# Patient Record
Sex: Male | Born: 1965 | Race: White | Hispanic: No | Marital: Married | Smoking: Never smoker
Health system: Southern US, Community
[De-identification: ages and names within clinical notes are randomized; demographics above are authoritative.]

---

## 2014-07-21 ENCOUNTER — Ambulatory Visit (INDEPENDENT_AMBULATORY_CARE_PROVIDER_SITE_OTHER): Payer: BC Managed Care – PPO | Admitting: Family Medicine

## 2014-07-21 ENCOUNTER — Encounter: Payer: Self-pay | Admitting: Family Medicine

## 2014-07-21 ENCOUNTER — Encounter (INDEPENDENT_AMBULATORY_CARE_PROVIDER_SITE_OTHER): Payer: Self-pay

## 2014-07-21 VITALS — BP 146/91 | HR 47 | Ht 70.0 in | Wt 170.0 lb

## 2014-07-21 DIAGNOSIS — M7662 Achilles tendinitis, left leg: Secondary | ICD-10-CM

## 2014-07-21 NOTE — Patient Instructions (Signed)
You have severe insertional achilles tendinitis. Wear cam walker with heel lifts every time you're up and walking around. Icing 15 minutes at a time 3-4 times a day. Ibuprofen 600mg  three times a day with food OR aleve 2 tabs twice a day with food for pain and inflammation regularly. If needed use crutches. Try to avoid inclines, hills, stairs as much as you can. Follow up with me in 7-10 days.

## 2014-07-22 ENCOUNTER — Encounter: Payer: Self-pay | Admitting: Family Medicine

## 2014-07-22 DIAGNOSIS — M7660 Achilles tendinitis, unspecified leg: Secondary | ICD-10-CM | POA: Insufficient documentation

## 2014-07-22 NOTE — Progress Notes (Signed)
Patient ID: Joshua Pacheco, male   DOB: Mar 26, 1966, 48 y.o.   MRN: [id number]  PCP: No primary provider on file.  Subjective:   HPI: Patient is a 48 y.o. male here for left foot pain.  Patient denies known injury. States for several weeks he has had posterior left heel pain off and on but it would resolve with icing and rest. Then about 4 days ago pain become more consistent, severe.   Runs 25 miles per week but nothing unusual about his last run on Thursday and no pain until Saturday-Sunday. Taking ibuprofen. Doing stretching, massage. No swelling or bruising.  History reviewed. No pertinent past medical history.  No current outpatient prescriptions on file prior to visit.   No current facility-administered medications on file prior to visit.    History reviewed. No pertinent past surgical history.  Allergies  Allergen Reactions  . Amoxicillin     History   Social History  . Marital Status: Married    Spouse Name: N/A    Number of Children: N/A  . Years of Education: N/A   Occupational History  . Not on file.   Social History Main Topics  . Smoking status: Never Smoker   . Smokeless tobacco: Not on file  . Alcohol Use: Not on file  . Drug Use: Not on file  . Sexual Activity: Not on file   Other Topics Concern  . Not on file   Social History Narrative  . No narrative on file    No family history on file.  BP 146/91  Pulse 47  Ht 5\' 10"  (1.778 m)  Wt 170 lb (77.111 kg)  BMI 24.39 kg/m2  Review of Systems: See HPI above.    Objective:  Physical Exam:  Gen: NAD  Left foot/ankle: No gross deformity, swelling, ecchymoses FROM ankle with pain on full dorsiflexion and plantar flexion.  Cannot do single leg calf raise. TTP achilles at insertion.  No plantar fascia, other foot/ankle tenderness. Negative ant drawer and talar tilt.   Negative syndesmotic compression. Thompsons test negative. Negative calcaneal squeeze. NV intact distally.     Assessment & Plan:  1. Left insertional achilles tendinitis - due to severity placed in cam walker with heel lift.  Icing, nsaids.  Avoid hills, inclines, stairs.  F/u in 7-10 days.

## 2014-07-22 NOTE — Assessment & Plan Note (Signed)
due to severity placed in cam walker with heel lift.  Icing, nsaids.  Avoid hills, inclines, stairs.  F/u in 7-10 days.

## 2014-07-30 ENCOUNTER — Ambulatory Visit: Payer: BC Managed Care – PPO | Admitting: Family Medicine

## 2019-05-05 ENCOUNTER — Telehealth: Payer: Self-pay | Admitting: Physician Assistant

## 2019-05-05 ENCOUNTER — Encounter: Payer: Self-pay | Admitting: Physician Assistant

## 2019-05-05 DIAGNOSIS — Z20822 Contact with and (suspected) exposure to covid-19: Secondary | ICD-10-CM

## 2019-05-05 DIAGNOSIS — R059 Cough, unspecified: Secondary | ICD-10-CM

## 2019-05-05 DIAGNOSIS — J029 Acute pharyngitis, unspecified: Secondary | ICD-10-CM

## 2019-05-05 DIAGNOSIS — G4489 Other headache syndrome: Secondary | ICD-10-CM

## 2019-05-05 DIAGNOSIS — R05 Cough: Secondary | ICD-10-CM

## 2019-05-05 MED ORDER — BENZONATATE 100 MG PO CAPS: 100.0000 mg | ORAL_CAPSULE | Freq: Two times a day (BID) | ORAL | 0 refills | Status: AC | PRN

## 2019-05-05 NOTE — Progress Notes (Signed)
E-Visit for Corona Virus Screening   Your current symptoms could be consistent with the coronavirus.  Many health care providers can now test patients at their office but not all are.  [facility] has multiple testing sites. For information on our COVID testing locations and hours go to [email or web address]  Please quarantine yourself while awaiting your test results.  We are enrolling you in our MyChart Home Montioring for COVID19 . Daily you will receive a questionnaire within the MyChart website. Our COVID 19 response team willl be monitoriing your responses daily.    COVID-19 is a respiratory illness with symptoms that are similar to the flu. Symptoms are typically mild to moderate, but there have been cases of severe illness and death due to the virus. The following symptoms may appear 2-14 days after exposure: . Fever . Cough . Shortness of breath or difficulty breathing . Chills . Repeated shaking with chills . Muscle pain . Headache . Sore throat . New loss of taste or smell . Fatigue . Congestion or runny nose . Nausea or vomiting . Diarrhea  It is vitally important that if you feel that you have an infection such as this virus or any other virus that you stay home and away from places where you may spread it to others.  You should self-quarantine for 14 days if you have symptoms that could potentially be coronavirus or have been in close contact a with a person diagnosed with COVID-19 within the last 2 weeks. You should avoid contact with people age 65 and older.   You should wear a mask or cloth face covering over your nose and mouth if you must be around other people or animals, including pets (even at home). Try to stay at least 6 feet away from other people. This will protect the people around you.  You can use medication such as A prescription cough medication called Tessalon Perles 100 mg. You may take 1-2 capsules every 8 hours as needed for  cough  You may also take acetaminophen (Tylenol) as needed for fever.  I have provided a work note  You have been enrolled in Evisit Covid19 Home Monitoring questionnaire, which will contact you regarding your symptoms.   Reduce your risk of any infection by using the same precautions used for avoiding the common cold or flu:  . Wash your hands often with soap and warm water for at least 20 seconds.  If soap and water are not readily available, use an alcohol-based hand sanitizer with at least 60% alcohol.  . If coughing or sneezing, cover your mouth and nose by coughing or sneezing into the elbow areas of your shirt or coat, into a tissue or into your sleeve (not your hands). . Avoid shaking hands with others and consider head nods or verbal greetings only. . Avoid touching your eyes, nose, or mouth with unwashed hands.  . Avoid close contact with people who are sick. . Avoid places or events with large numbers of people in one location, like concerts or sporting events. . Carefully consider travel plans you have or are making. . If you are planning any travel outside or inside the [place], visit the CDC's Travelers' Health webpage for the latest health notices. . If you have some symptoms but not all symptoms, continue to monitor at home and seek medical attention if your symptoms worsen. . If you are having a medical emergency, call 911.  HOME CARE . Only take medications as instructed by your medical   team. . Drink plenty of fluids and get plenty of rest. . A steam or ultrasonic humidifier can help if you have congestion.   GET HELP RIGHT AWAY IF YOU HAVE EMERGENCY WARNING SIGNS** FOR COVID-19. If you or someone is showing any of these signs seek emergency medical care immediately. Call 911 or proceed to your closest emergency facility if: . You develop worsening high fever. . Trouble breathing . Bluish lips or face . Persistent pain or pressure in the chest . New confusion . Inability  to wake or stay awake . You cough up blood. . Your symptoms become more severe  **This list is not all possible symptoms. Contact your medical provider for any symptoms that are sever or concerning to you.   MAKE SURE YOU   Understand these instructions.  Will watch your condition.  Will get help right away if you are not doing well or get worse.  Your e-visit answers were reviewed by a board certified advanced clinical practitioner to complete your personal care plan.  Depending on the condition, your plan could have included both over the counter or prescription medications.  If there is a problem please reply once you have received a response from your provider.  Your safety is important to us.  If you have drug allergies check your prescription carefully.    You can use MyChart to ask questions about today's visit, request a non-urgent call back, or ask for a work or school excuse for 24 hours related to this e-Visit. If it has been greater than 24 hours you will need to follow up with your provider, or enter a new e-Visit to address those concerns. You will get an e-mail in the next two days asking about your experience.  I hope that your e-visit has been valuable and will speed your recovery. Thank you for using e-visits.   I spent 5-10 minutes on review and completion of this note- Ajene Carchi PAC  

## 2019-08-19 ENCOUNTER — Other Ambulatory Visit: Payer: Self-pay

## 2019-08-19 ENCOUNTER — Ambulatory Visit (INDEPENDENT_AMBULATORY_CARE_PROVIDER_SITE_OTHER): Payer: BC Managed Care – PPO | Admitting: Otolaryngology

## 2019-08-19 VITALS — Temp 97.2°F

## 2019-08-19 DIAGNOSIS — H6981 Other specified disorders of Eustachian tube, right ear: Secondary | ICD-10-CM

## 2019-08-19 DIAGNOSIS — H9201 Otalgia, right ear: Secondary | ICD-10-CM | POA: Diagnosis not present

## 2019-08-19 NOTE — Progress Notes (Signed)
HPI: Joshua Pacheco is a 53 y.o. male who returns today for evaluation of complaints of right ear fullness occasional pain or discomfort in the right ear which generally is more below the ear.  At times he feels like he has fluid in the ear.  The pain is sometimes position dependent.  The symptoms come and go but have been present for several months now.  He denies sore throat.  Chewing does not seem to make it worse.  He has had no drainage from the ear and no real hearing problems. He does not smoke. He has tried nasal steroid sprays since last visit but this does not seem to make that much difference..  No past medical history on file. No past surgical history on file. Social History   Socioeconomic History  . Marital status: Married    Spouse name: Not on file  . Number of children: Not on file  . Years of education: Not on file  . Highest education level: Not on file  Occupational History  . Not on file  Social Needs  . Financial resource strain: Not on file  . Food insecurity    Worry: Not on file    Inability: Not on file  . Transportation needs    Medical: Not on file    Non-medical: Not on file  Tobacco Use  . Smoking status: Never Smoker  Substance and Sexual Activity  . Alcohol use: Not on file  . Drug use: Not on file  . Sexual activity: Not on file  Lifestyle  . Physical activity    Days per week: Not on file    Minutes per session: Not on file  . Stress: Not on file  Relationships  . Social Herbalist on phone: Not on file    Gets together: Not on file    Attends religious service: Not on file    Active member of club or organization: Not on file    Attends meetings of clubs or organizations: Not on file    Relationship status: Not on file  Other Topics Concern  . Not on file  Social History Narrative  . Not on file   No family history on file. Allergies  Allergen Reactions  . Amoxicillin    Prior to Admission medications   Medication  Sig Start Date End Date Taking? Authorizing Provider  benzonatate (TESSALON) 100 MG capsule Take 1 capsule (100 mg total) by mouth 2 (two) times daily as needed for cough. 05/05/19   Lacy Duverney M, PA-C     Positive ROS: Otherwise negative  All other systems have been reviewed and were otherwise negative with the exception of those mentioned in the HPI and as above.  Physical Exam: General: Alert, no acute distress Ears: Ear canals are clear bilaterally with intact, clear TMs.  TMs have normal mobility on pneumatic otoscopy.  Right ear middle ear space is clear under microscopic examination.  Tuning fork testing revealed symmetric hearing with good hearing in both ears and AC > BC bilaterally. Nasal: Clear nasal passages.  Mild rhinitis no signs of infection Oral: Clear oropharynx.  Tonsillar regions are benign in appearance bilaterally. Neck: No palpable adenopathy or masses.  No palpable masses around the right ear.  Ears not painful to movement.  No palpable TMJ pain discomfort or problems.   Assessment: Right otalgia questionable etiology possible eustachian tube dysfunction versus neuropathic pain.  Plan: Would recommend continue with the nasal steroid spray on a  regular basis.  He can occasionally try decongestant therapy to see if this helps at all. If he continues with a sensation of fluid or pressure in the ear could consider myringotomy in the future. Recommended use of NSAIDs on a regular basis for 2 weeks to see if this helps resolve the pain. No indication presently but if symptoms persist could consider temporal bone CT scan.   Narda Bonds, MD

## 2019-11-16 ENCOUNTER — Other Ambulatory Visit (INDEPENDENT_AMBULATORY_CARE_PROVIDER_SITE_OTHER): Payer: Self-pay

## 2019-11-16 ENCOUNTER — Ambulatory Visit (INDEPENDENT_AMBULATORY_CARE_PROVIDER_SITE_OTHER): Payer: BC Managed Care – PPO | Admitting: Otolaryngology

## 2019-11-16 DIAGNOSIS — G8929 Other chronic pain: Secondary | ICD-10-CM

## 2019-12-11 ENCOUNTER — Other Ambulatory Visit (INDEPENDENT_AMBULATORY_CARE_PROVIDER_SITE_OTHER): Payer: Self-pay | Admitting: Otolaryngology

## 2019-12-11 DIAGNOSIS — G8929 Other chronic pain: Secondary | ICD-10-CM

## 2019-12-21 ENCOUNTER — Other Ambulatory Visit: Payer: Self-pay

## 2019-12-21 ENCOUNTER — Ambulatory Visit
Admission: RE | Admit: 2019-12-21 | Discharge: 2019-12-21 | Disposition: A | Discharge status: Home / Self Care | Payer: BC Managed Care – PPO | Source: Ambulatory Visit | Attending: Otolaryngology | Admitting: Otolaryngology

## 2019-12-21 DIAGNOSIS — G8929 Other chronic pain: Secondary | ICD-10-CM

## 2019-12-21 MED ORDER — IOPAMIDOL (ISOVUE-300) INJECTION 61%
75.0000 mL | Freq: Once | INTRAVENOUS | Status: AC | PRN
  Administered 2019-12-21: 09:00:00 75 mL via INTRAVENOUS

## 2020-01-05 ENCOUNTER — Telehealth (INDEPENDENT_AMBULATORY_CARE_PROVIDER_SITE_OTHER): Payer: Self-pay | Admitting: Otolaryngology

## 2020-01-05 NOTE — Telephone Encounter (Signed)
Discussed with Joshua Pacheco today on the phone concerning results of his temporal bone CT scan.  This showed normal temporal bone with no explanation for right ear discomfort, right ear pressure, or pulsatile tinnitus.  The middle ear space and mastoid area was clear.  Eustachian tube was unobstructed in nasopharynx was clear. He states that the discomfort in the right ear tends to get worse when he is sitting at the computer for long period of time.  This would indicate the pain may be more musculoskeletal in nature or possibly neuropathy from cervical nerves.  Suggested use of NSAIDs which he has used.

## 2020-01-08 ENCOUNTER — Ambulatory Visit: Payer: BC Managed Care – PPO | Attending: Internal Medicine

## 2020-01-08 DIAGNOSIS — Z23 Encounter for immunization: Secondary | ICD-10-CM

## 2020-01-08 NOTE — Progress Notes (Signed)
   Covid-19 Vaccination Clinic  Name:  Arkin Imran    MRN: [id number] DOB: 1966/08/22  01/08/2020  Mr. Sedano was observed post Covid-19 immunization for 15 minutes without incident. He was provided with Vaccine Information Sheet and instruction to access the V-Safe system.   Mr. Shenk was instructed to call 911 with any severe reactions post vaccine: Marland Kitchen Difficulty breathing  . Swelling of face and throat  . A fast heartbeat  . A bad rash all over body  . Dizziness and weakness   Immunizations Administered    Name Date Dose VIS Date Route   Pfizer COVID-19 Vaccine 01/08/2020 10:13 AM 0.3 mL 09/25/2019 Intramuscular   Manufacturer: [employer], [employer]   Lot: [id number]   NDC: [address]-4

## 2020-02-02 ENCOUNTER — Ambulatory Visit: Payer: BC Managed Care – PPO

## 2020-02-03 ENCOUNTER — Ambulatory Visit: Payer: BC Managed Care – PPO | Attending: Internal Medicine

## 2020-02-03 DIAGNOSIS — Z23 Encounter for immunization: Secondary | ICD-10-CM

## 2020-02-03 NOTE — Progress Notes (Signed)
   Covid-19 Vaccination Clinic  Name:  Joshua Pacheco    MRN: [id number] DOB: 01/12/1966  02/03/2020  [name] was observed post Covid-19 immunization for 15 minutes without incident. He was provided with Vaccine Information Sheet and instruction to access the V-Safe system.   [name] was instructed to call 911 with any severe reactions post vaccine: Marland Kitchen Difficulty breathing  . Swelling of face and throat  . A fast heartbeat  . A bad rash all over body  . Dizziness and weakness   Immunizations Administered    Name Date Dose VIS Date Route   Pfizer COVID-19 Vaccine 02/03/2020  2:30 PM 0.3 mL 12/09/2018 Intramuscular   Manufacturer: [employer], [employer]   Lot: [id number]   NDC: [address]-4

## 2020-11-15 IMAGING — CT CT TEMPORAL BONES W/ CM
2 series · 15 of 40 positions shown, 18 images · IV contrast (iopamidol)
Comparison: None.

CLINICAL DATA: Pulsatile tinnitus.  Chronic right ear pain

EXAM:
CT TEMPORAL BONES WITH CONTRAST
TECHNIQUE: Axial and coronal plane CT imaging of the petrous temporal bones was
performed with thin-collimation image reconstruction after
intravenous contrast administration. Multiplanar CT image
reconstructions were also generated.
CONTRAST:  75mL KIPVYX-MNN IOPAMIDOL (KIPVYX-MNN) INJECTION 61%

[Series 4: soft tissue · axial · 0.31mm/px · z∈[-100,-24]mm · 12 of 44 slices shown, 15 images]
[im 3/44  brain]
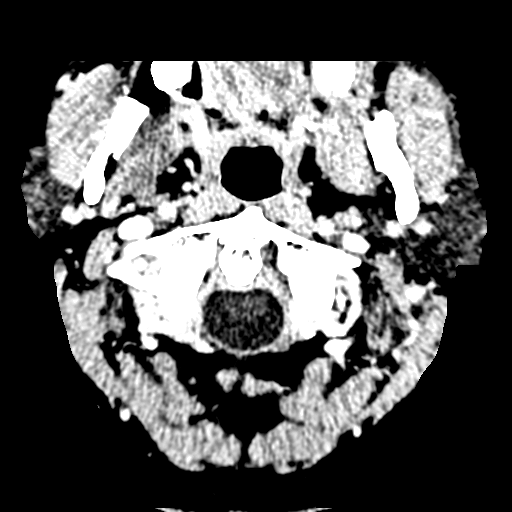
[im 3/44  bone]
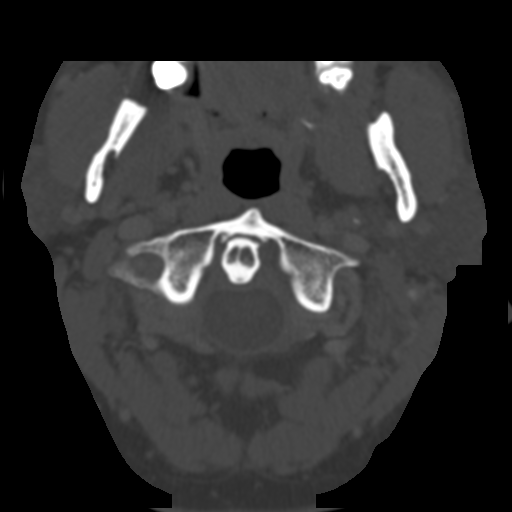
[im 6/44  bone]
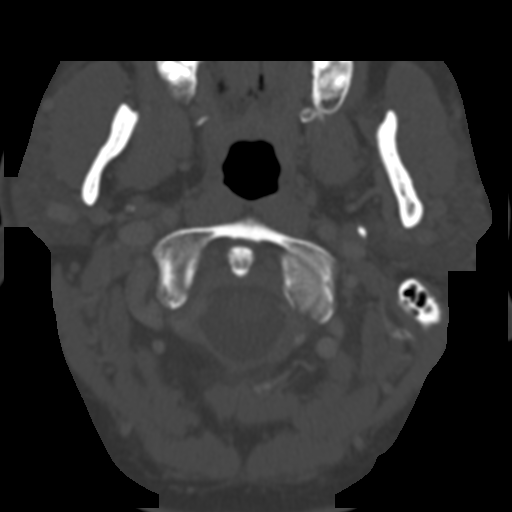
[im 9/44  bone]
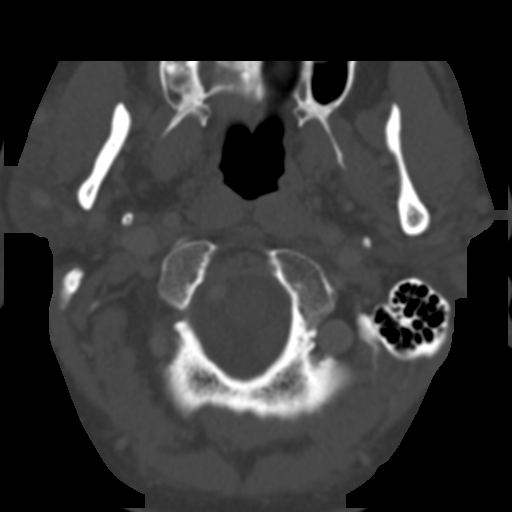
[im 14/44  bone]
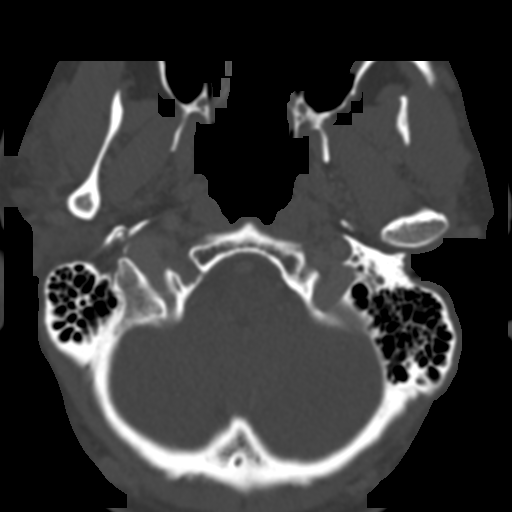
[im 17/44  brain]
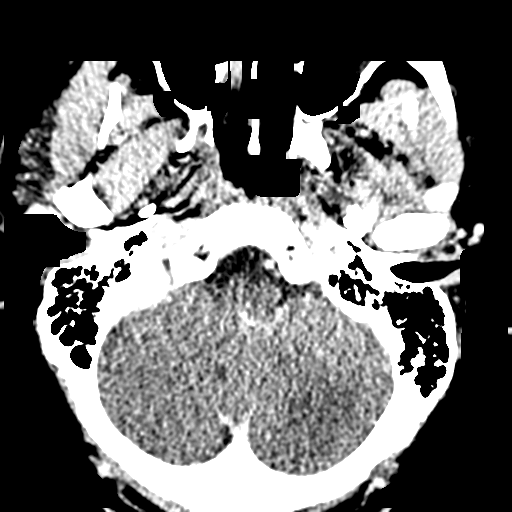
[im 17/44  bone]
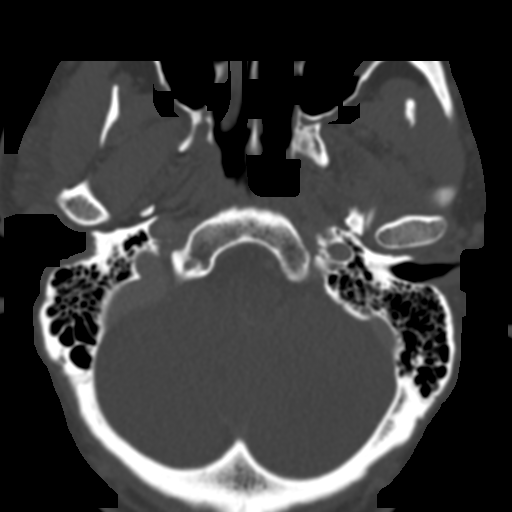
[im 20/44  bone]
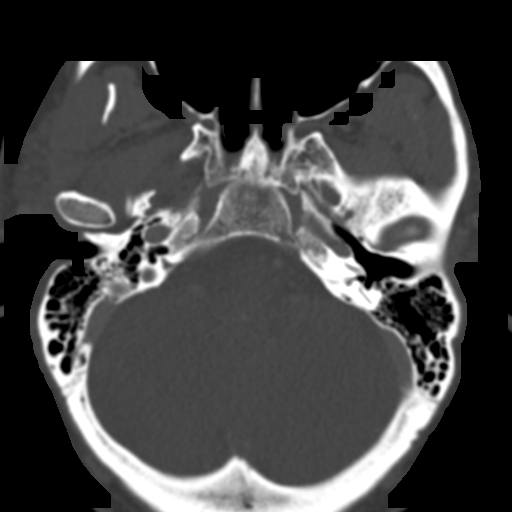
[im 24/44  bone]
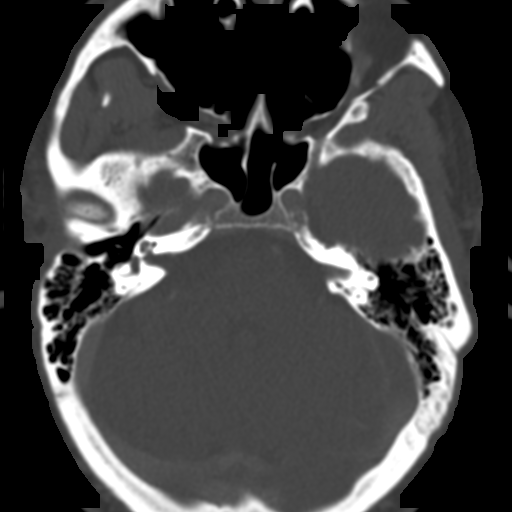
[im 27/44  bone]
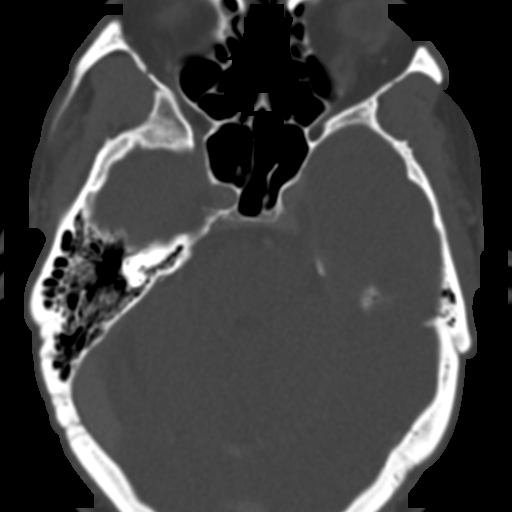
[im 30/44  brain]
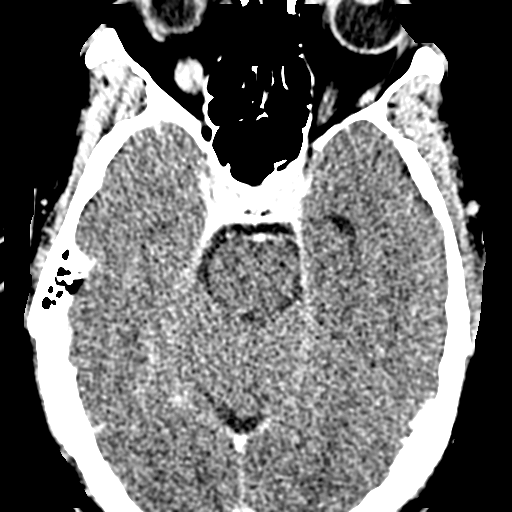
[im 30/44  bone]
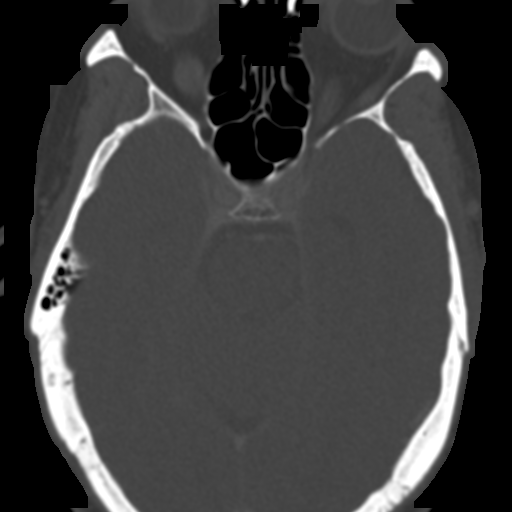
[im 35/44  bone]
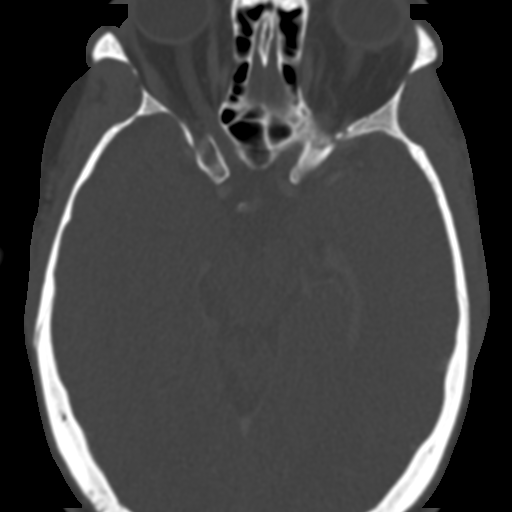
[im 38/44  bone]
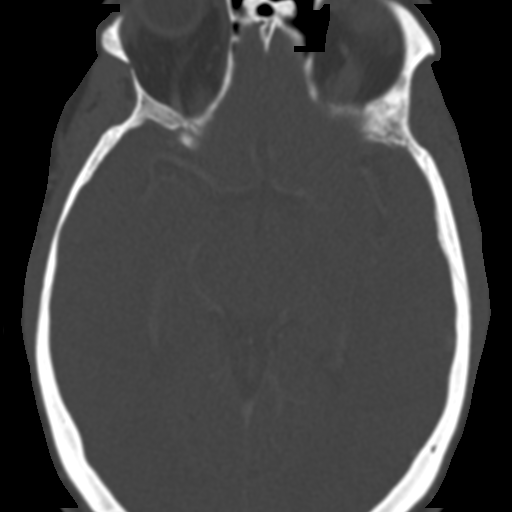
[im 41/44  bone]
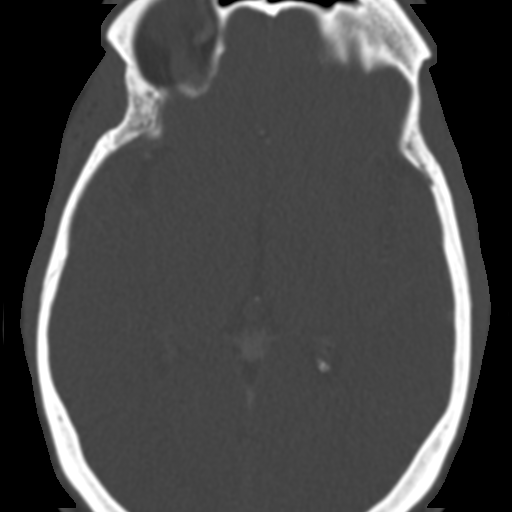

[Series 9: cor mag right · coronal · 0.17mm/px · 3 of 164 slices shown]
[im 55/164  bone]
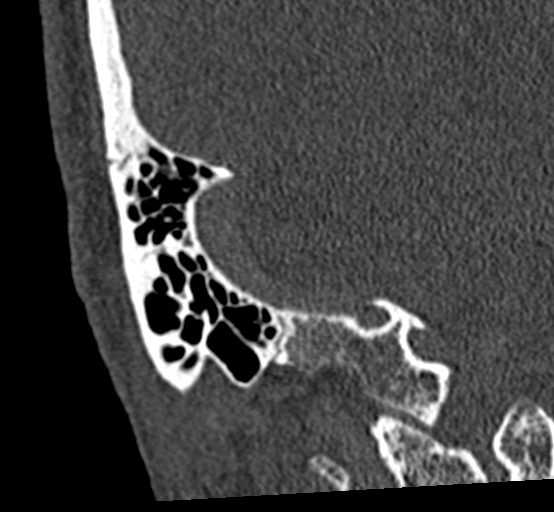
[im 73/164  bone]
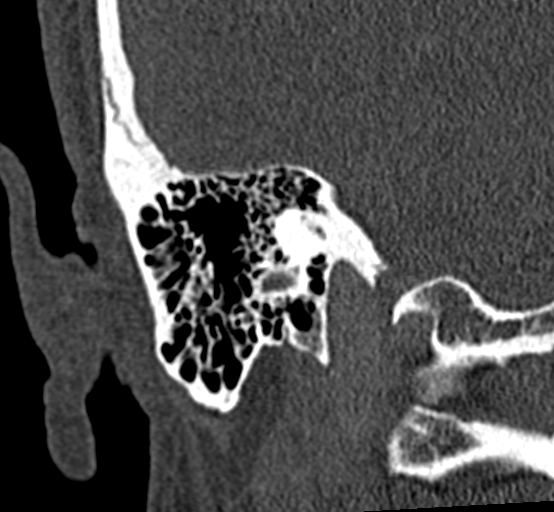
[im 91/164  bone]
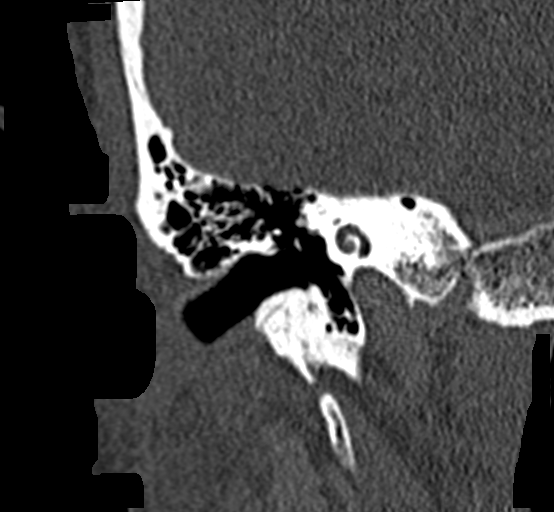

[15 of 40 positions shown; findings below may reference images not displayed]

FINDINGS: Right temporal bone: The Diyang and external auditory canal are
unremarkable. The tympanic membrane is thin. The ossicles are
normally formed and aligned. Mastoid and middle ear is well
pneumatized and aerated. The labyrinthine structures appear normally
formed and bony covered. Internal auditory canal is normal in size.
The vestibular aqueduct is normal in size. The carotid and sigmoid
sinus are bone covered. The right sigmoid dural sinus is dominant.
No diverticulum or high bulb. No apparent narrowing or irregularity
of the carotids. Unremarkable facial nerve canal.

Left temporal bone: The Diyang and external auditory canal are
unremarkable. The tympanic membrane is thin. The ossicles are
normally formed and aligned. Mastoid and middle ear is well
pneumatized and aerated. The labyrinth structures appear normally
formed and bony covered. Internal auditory canal is normal in size.
The vestibular aqueduct is normal in size. The carotid and sigmoid
sinus are bone covered. Unremarkable facial nerve canal.

Moderate presumed retention cysts in the left maxillary sinus.
IMPRESSION: Negative CT of the temporal bones.  No explanation for tinnitus.

## 2022-03-17 ENCOUNTER — Other Ambulatory Visit: Payer: Self-pay

## 2022-03-17 ENCOUNTER — Encounter (HOSPITAL_BASED_OUTPATIENT_CLINIC_OR_DEPARTMENT_OTHER): Payer: Self-pay | Admitting: Emergency Medicine

## 2022-03-17 ENCOUNTER — Emergency Department (HOSPITAL_BASED_OUTPATIENT_CLINIC_OR_DEPARTMENT_OTHER)
Admission: EM | Admit: 2022-03-17 | Discharge: 2022-03-17 | Disposition: A | Discharge status: Home / Self Care | Payer: BC Managed Care – PPO | Attending: Emergency Medicine | Admitting: Emergency Medicine

## 2022-03-17 DIAGNOSIS — T63441A Toxic effect of venom of bees, accidental (unintentional), initial encounter: Secondary | ICD-10-CM

## 2022-03-17 DIAGNOSIS — W57XXXA Bitten or stung by nonvenomous insect and other nonvenomous arthropods, initial encounter: Secondary | ICD-10-CM | POA: Insufficient documentation

## 2022-03-17 DIAGNOSIS — T7840XA Allergy, unspecified, initial encounter: Secondary | ICD-10-CM | POA: Diagnosis not present

## 2022-03-17 MED ORDER — METHYLPREDNISOLONE SODIUM SUCC 125 MG IJ SOLR
125.0000 mg | Freq: Once | INTRAMUSCULAR | Status: AC
  Administered 2022-03-17: 125 mg via INTRAVENOUS
  Filled 2022-03-17: qty 2

## 2022-03-17 MED ORDER — SODIUM CHLORIDE 0.9 % IV BOLUS
1000.0000 mL | Freq: Once | INTRAVENOUS | Status: AC
  Administered 2022-03-17: 1000 mL via INTRAVENOUS

## 2022-03-17 MED ORDER — FAMOTIDINE IN NACL 20-0.9 MG/50ML-% IV SOLN
20.0000 mg | Freq: Once | INTRAVENOUS | Status: AC
  Administered 2022-03-17: 20 mg via INTRAVENOUS
  Filled 2022-03-17: qty 50

## 2022-03-17 MED ORDER — DIPHENHYDRAMINE HCL 50 MG/ML IJ SOLN
50.0000 mg | Freq: Once | INTRAMUSCULAR | Status: AC
Start: 2022-03-17 — End: 2022-03-17
  Administered 2022-03-17: 50 mg via INTRAVENOUS
  Filled 2022-03-17: qty 1

## 2022-03-17 NOTE — Discharge Instructions (Signed)
You were seen today for a probable allergic reaction to bee or wasp sting.  You have been given medications that will hopefully help with the inflammation and allergic reaction.  Please return if you develop shortness of breath, chest pain or other life threats.  Recommend follow-up with primary care to discuss possible EpiPen if this happens in the future.

## 2022-03-17 NOTE — ED Triage Notes (Signed)
Pt arrives to ED with c/o multiple bee stings. On sting above right eye and one sting to the left forearm. Pt does report throat tightness and dizziness after the bite. He reports these slightly improved.

## 2022-03-17 NOTE — ED Notes (Signed)
Discharge instructions and follow up care reviewed and explained, pt verbalized understanding and had no further questions on discharge. Pt caox4, ambulatory, and in no obvious distress on departure.

## 2022-03-17 NOTE — ED Provider Notes (Signed)
[facility] EMERGENCY DEPT Provider Note   CSN: [id number] Arrival date & time: 03/17/22  1724     History  Chief Complaint  Patient presents with   Bee Sting    Joshua Pacheco is a 56 y.o. male.  Patient presents to the emergency department with concerns of lightheadedness and tightening of the throat after bee stings that happened at approximately 2 PM.  The patient was stung on the left forearm and above the right eyebrow.  He states that he is not sure if it was a bee or a wasp.  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  He states that his symptoms have improved somewhat at this time.  He has taken no medicine prior to arrival.  No history of allergy to stings.  HPI     Home Medications Prior to Admission medications   Medication Sig Start Date End Date Taking? Authorizing Provider  benzonatate (TESSALON) 100 MG capsule Take 1 capsule (100 mg total) by mouth 2 (two) times daily as needed for cough. 05/05/19   Demetrio Lapping, PA-C      Allergies    Amoxicillin    Review of Systems   Review of Systems  HENT:  Positive for sore throat.   Respiratory:  Negative for shortness of breath.   Cardiovascular:  Negative for chest pain.  Gastrointestinal:  Negative for abdominal pain, nausea and vomiting.  Neurological:  Positive for light-headedness.   Physical Exam Updated Vital Signs BP (!) 141/100 (BP Location: Right Arm)   Pulse 69   Temp 98.3 F (36.8 C) (Oral)   Resp 16   Ht 5\' 10"  (1.778 m)   Wt 81.6 kg   SpO2 97%   BMI 25.83 kg/m  Physical Exam Vitals and nursing note reviewed.  Constitutional:      General: He is not in acute distress.    Appearance: He is normal weight.  HENT:     Head: Normocephalic and atraumatic.     Mouth/Throat:     Mouth: Mucous membranes are moist.     Pharynx: Oropharynx is clear.  Eyes:     Conjunctiva/sclera: Conjunctivae normal.   Cardiovascular:     Rate and Rhythm: Normal rate and regular rhythm.     Pulses: Normal pulses.  Pulmonary:     Effort: Pulmonary effort is normal.     Breath sounds: Normal breath sounds.  Abdominal:     Palpations: Abdomen is soft.     Tenderness: There is no abdominal tenderness.  Musculoskeletal:     Cervical back: Normal range of motion and neck supple.  Skin:    General: Skin is warm and dry.  Neurological:     Mental Status: He is alert.    ED Results / Procedures / Treatments   Labs (all labs ordered are listed, but only abnormal results are displayed) Labs Reviewed - No data to display  EKG None  Radiology No results found.  Procedures Procedures    Medications Ordered in ED Medications  sodium chloride 0.9 % bolus 1,000 mL (0 mLs Intravenous Stopped 03/17/22 1918)  methylPREDNISolone sodium succinate (SOLU-MEDROL) 125 mg/2 mL injection 125 mg (125 mg Intravenous Given 03/17/22 1815)  diphenhydrAMINE (BENADRYL) injection 50 mg (50 mg Intravenous Given 03/17/22 1819)  famotidine (PEPCID) IVPB 20 mg premix (0 mg Intravenous Stopped 03/17/22 1945)    ED Course/ Medical Decision Making/ A&P  Medical Decision Making Risk Prescription drug management.   This patient presents to the ED for concern of bee sting, this involves an extensive number of treatment options, and is a complaint that carries with it a high risk of complications and morbidity.  The differential diagnosis includes allergic reaction, anaphylactic reaction, and others     Problem List / ED Course / Critical interventions / Medication management   I ordered medication including Pepcid, saline, Benadryl, and Solu-Medrol for possible allergic reaction Reevaluation of the patient after these medicines showed that the patient improved I have reviewed the patients home medicines and have made adjustments as needed  Test / Admission - Considered:  The patient improved  greatly after administration of medications.  He was feeling much better.  I believe the patient has a mild allergic reaction to the stings earlier tonight.  Recommend that he seek medical care if he has repeat symptoms in the future.  He may want to consider talking to primary care about possible EpiPen if this happens again  Final Clinical Impression(s) / ED Diagnoses Final diagnoses:  Bee sting, accidental or unintentional, initial encounter  Allergic reaction, initial encounter    Rx / DC Orders ED Discharge Orders     None         Pamala Duffel 03/17/22 2224    Edwin Dada P, DO 03/18/22 1829
